# Patient Record
Sex: Male | Born: 1995 | Race: Black or African American | Hispanic: No | Marital: Single | Smoking: Never smoker
Health system: Southern US, Community
[De-identification: ages and names within clinical notes are randomized; demographics above are authoritative.]

## PROBLEM LIST (undated history)

## (undated) DIAGNOSIS — J45909 Unspecified asthma, uncomplicated: Secondary | ICD-10-CM

## (undated) DIAGNOSIS — G43909 Migraine, unspecified, not intractable, without status migrainosus: Secondary | ICD-10-CM

## (undated) HISTORY — PX: OTHER SURGICAL HISTORY: SHX169

---
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
## 2016-02-06 ENCOUNTER — Ambulatory Visit (INDEPENDENT_AMBULATORY_CARE_PROVIDER_SITE_OTHER): Payer: 59 | Admitting: Family Medicine

## 2016-02-06 ENCOUNTER — Encounter: Payer: Self-pay | Admitting: Family Medicine

## 2016-02-06 VITALS — BP 128/82 | HR 68 | Temp 98.4°F | Ht 68.75 in | Wt 161.0 lb

## 2016-02-06 DIAGNOSIS — M79641 Pain in right hand: Secondary | ICD-10-CM | POA: Diagnosis not present

## 2016-02-06 DIAGNOSIS — G43909 Migraine, unspecified, not intractable, without status migrainosus: Secondary | ICD-10-CM | POA: Insufficient documentation

## 2016-02-06 DIAGNOSIS — M79642 Pain in left hand: Secondary | ICD-10-CM | POA: Diagnosis not present

## 2016-02-06 DIAGNOSIS — G43019 Migraine without aura, intractable, without status migrainosus: Secondary | ICD-10-CM

## 2016-02-06 MED ORDER — MELOXICAM 15 MG PO TABS: 15.0000 mg | ORAL_TABLET | Freq: Every day | ORAL | Status: DC

## 2016-02-06 MED ORDER — SUMATRIPTAN SUCCINATE 50 MG PO TABS: 50.0000 mg | ORAL_TABLET | ORAL | Status: DC | PRN

## 2016-02-06 NOTE — Patient Instructions (Signed)
Trial sumatriptan for migraine headaches when they occur (of headache types this is the most likely with symptoms and prior diagnosis)  Let me know if this works for you when you have headaches  Glad you have a week off work. I want you to try antiinflammatory medicine for 10-15 days while you take week off. When you go back to work- see if there is an alternate why to hit the parts that may reduce strain. If still having same issues in 2 weeks- send me mychart message and I will send you a letter to have them find a different position for you hopefully

## 2016-02-06 NOTE — Progress Notes (Signed)
Phone: [phone]  Subjective:  Patient presents today to establish care. Chief complaint-noted.   See problem oriented charting  The following were reviewed and entered/updated in epic: Past Medical History  Diagnosis Date  . Asthma     childhood  . Migraine     Pediatrician diagnosed. Was on medication before. about once a month. Left frontal, throbbing, debilitating. Lasts for hours- eventually goes away.    Patient Active Problem List   Diagnosis Date Noted  . Migraine     Priority: Medium  . Asthma 04/25/2013    Priority: Low   Past Surgical History  Procedure Laterality Date  . None      Family History  Problem Relation Age of Onset  . Stroke Father 2841    stress related reportedly. states no meds    Medications- reviewed and updated No current outpatient prescriptions on file.   No current facility-administered medications for this visit.   Allergies-reviewed and updated No Known Allergies  Social History   Social History  . Marital Status: Single    Spouse Name: N/A  . Number of Children: N/A  . Years of Education: N/A   Social History Main Topics  . Smoking status: Never Smoker   . Smokeless tobacco: None  . Alcohol Use: No  . Drug Use: No  . Sexual Activity: Not Asked   Other Topics Concern  . None   Social History Narrative   Single. Lives with family.    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     ROS--Full ROS was completed Review of Systems  Constitutional: Negative for fever and chills.  HENT: Negative for congestion, ear discharge, hearing loss, nosebleeds, sore throat and tinnitus.   Eyes: Negative for blurred vision and double vision.  Respiratory: Negative for cough and shortness of breath.   Cardiovascular: Negative for  chest pain and palpitations.  Gastrointestinal: Positive for nausea (mild nausea with headaches). Negative for heartburn.  Genitourinary: Negative for dysuria and urgency.  Musculoskeletal: Negative for myalgias and neck pain.  Skin: Negative for itching and rash.  Neurological: Positive for headaches. Negative for dizziness and tingling.  Endo/Heme/Allergies: Negative for polydipsia. Does not bruise/bleed easily.  Psychiatric/Behavioral: Negative for depression, hallucinations and substance abuse.   Objective: BP 128/82 mmHg  Pulse 68  Temp(Src) 98.4 F (36.9 C) (Oral)  Ht 5' 8.75" (1.746 m)  Wt 161 lb (73.029 kg)  BMI 23.96 kg/m2  SpO2 98% Gen: NAD, resting comfortably HEENT: Mucous membranes are moist. Oropharynx normal. TM normal. Eyes: sclera and lids normal, PERRLA Neck: no thyromegaly, no cervical lymphadenopathy CV: RRR no murmurs rubs or gallops Lungs: CTAB no crackles, wheeze, rhonchi Abdomen: soft/nontender/nondistended/normal bowel sounds. No rebound or guarding.  Ext: no edema, pain in palms of hands with palpation mild  Skin: warm, dry Neuro: CN II-XII intact, sensation and reflexes normal throughout, 5/5 muscle strength in bilateral upper and lower extremities. Normal finger to nose. Normal rapid alternating movements. No pronator drift. Normal romberg. Normal gait.   Assessment/Plan:  Bilateral hand pain S:Has to hit an object at work over 2000 units - hitting on hands and that bothers. Sticks around even with a few days off work. Gets worse at work. 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 Hard to bend his finger. Started job a week ago and has been since then. With time away from work symptoms largely resolve- has next week off. This just started about a week ago when he started job- he was told there was no other way to hit the object at work (I has asked patient  several times if there were other ways and he initially acknowledged would ask then when discussing work note shifted and said there was no other way- I still encouraged him to ask) A/P: This appears to be overuse injury. Offered x-ray which he declines with cost concern. Will trial mobic 10-15 days and has at least week off work- if he returns and starts again and there is no othre option for this work movement- will request they find a different position for ptaient if possible    Migraine S: ician diagnosed. Was on medication before. about once a month. Left frontal, throbbing, debilitating. Lasts for hours- eventually goes away.  Some nausea. Review of records shows patient previously with more frequent headaches it appears and sumatriptan did not work in that setting- rebound?  A/P: we will retrial sumatriptan and asked patient to reach out through my chart regarding efficacy. No prophylaxis as states once a month only    Meds ordered this encounter  Medications  . SUMAtriptan (IMITREX) 50 MG tablet    Sig: Take 1 tablet (50 mg total) by mouth every 2 (two) hours as needed for migraine (2 per day maximum). Repeat in 2 hours if headache recurs    Dispense:  10 tablet    Refill:  2  . meloxicam (MOBIC) 15 MG tablet    Sig: Take 1 tablet (15 mg total) by mouth daily.    Dispense:  15 tablet    Refill:  0   Return precautions advised.  Tana ConchStephen Maram Bently, MD

## 2016-02-06 NOTE — Progress Notes (Signed)
Pre visit review using our clinic review tool, if applicable. No additional management support is needed unless otherwise documented below in the visit note. 

## 2016-02-07 NOTE — Assessment & Plan Note (Signed)
S: ician diagnosed. Was on medication before. about once a month. Left frontal, throbbing, debilitating. Lasts for hours- eventually goes away.  Some nausea. Review of records shows patient previously with more frequent headaches it appears and sumatriptan did not work in that setting- rebound?  A/P: we will retrial sumatriptan and asked patient to reach out through my chart regarding efficacy. No prophylaxis as states once a month only

## 2016-03-03 ENCOUNTER — Encounter: Payer: Self-pay | Admitting: Pediatrics

## 2016-03-04 ENCOUNTER — Encounter: Payer: Self-pay | Admitting: Pediatrics

## 2016-04-19 ENCOUNTER — Ambulatory Visit (INDEPENDENT_AMBULATORY_CARE_PROVIDER_SITE_OTHER): Payer: 59 | Admitting: Family Medicine

## 2016-04-19 ENCOUNTER — Encounter: Payer: Self-pay | Admitting: Family Medicine

## 2016-04-19 VITALS — BP 122/62 | HR 83 | Temp 98.1°F | Ht 69.0 in | Wt 161.0 lb

## 2016-04-19 DIAGNOSIS — G43019 Migraine without aura, intractable, without status migrainosus: Secondary | ICD-10-CM

## 2016-04-19 DIAGNOSIS — R6889 Other general symptoms and signs: Secondary | ICD-10-CM | POA: Diagnosis not present

## 2016-04-19 DIAGNOSIS — Z0001 Encounter for general adult medical examination with abnormal findings: Secondary | ICD-10-CM | POA: Diagnosis not present

## 2016-04-19 MED ORDER — SUMATRIPTAN SUCCINATE 50 MG PO TABS: 50.0000 mg | ORAL_TABLET | ORAL | 2 refills | Status: DC | PRN

## 2016-04-19 MED ORDER — NADOLOL 20 MG PO TABS: 20.0000 mg | ORAL_TABLET | Freq: Every day | ORAL | 5 refills | Status: DC

## 2016-04-19 NOTE — Patient Instructions (Addendum)
Let us know when you get flu shot either by phone or mychart  Trial nadolol 20mg  to help prevent headaches. My hope is that instead of 3-4 that you may have 1-2 instead. You can use the sumaptriptan as needed if you still have migraines  Follow up with me in 4-6 weeks to recheck- may have to increase dose

## 2016-04-19 NOTE — Assessment & Plan Note (Signed)
S: patient states at last visit that he underestimated his HA frequency. He has noted since that time 3-4 migraines a week. 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  A/P: Will trial nadolol 20mg  daily for prophylaxis since atenolol on back order. He never picked up sumatriptan-states he was told needed to be resent so sent this in today. 

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
## 2016-05-11 ENCOUNTER — Encounter (HOSPITAL_COMMUNITY): Payer: Self-pay | Admitting: Family Medicine

## 2016-05-11 ENCOUNTER — Ambulatory Visit (HOSPITAL_COMMUNITY)
Admission: EM | Admit: 2016-05-11 | Discharge: 2016-05-11 | Disposition: A | Discharge status: Home / Self Care | Payer: 59 | Attending: Family Medicine | Admitting: Family Medicine

## 2016-05-11 DIAGNOSIS — R05 Cough: Secondary | ICD-10-CM

## 2016-05-11 DIAGNOSIS — R0982 Postnasal drip: Secondary | ICD-10-CM | POA: Diagnosis not present

## 2016-05-11 DIAGNOSIS — J329 Chronic sinusitis, unspecified: Secondary | ICD-10-CM

## 2016-05-11 DIAGNOSIS — J00 Acute nasopharyngitis [common cold]: Secondary | ICD-10-CM | POA: Diagnosis not present

## 2016-05-11 DIAGNOSIS — J310 Chronic rhinitis: Secondary | ICD-10-CM

## 2016-05-11 DIAGNOSIS — R059 Cough, unspecified: Secondary | ICD-10-CM

## 2016-05-11 NOTE — ED Provider Notes (Signed)
CSN: [id number]     Arrival date & time 05/11/16  1658 History   First MD Initiated Contact with Patient 05/11/16 1813     No chief complaint on file.  (Consider location/radiation/quality/duration/timing/severity/associated sxs/prior Treatment) 20 year old male's coming by his grandmother with complaints of cough, sore throat, headache, sinus pressure, rhinorrhea. He states he started about 2 weeks ago. He states he thinks he may have had a fever today he felt hot and was administered ibuprofen. No shortness of breath. Current temperature is 98.5.      Past Medical History:  Diagnosis Date  . Asthma    childhood  . Migraine    Pediatrician diagnosed. Was on medication before. about once a month. Left frontal, throbbing, debilitating. Lasts for hours- eventually goes away.    Past Surgical History:  Procedure Laterality Date  . none     Family History  Problem Relation Age of Onset  . Stroke Father 4641    stress related reportedly. states no meds   Social History  Substance Use Topics  . Smoking status: Never Smoker  . Smokeless tobacco: Not on file  . Alcohol use No    Review of Systems  Constitutional: Positive for activity change and fever. Negative for diaphoresis and fatigue.  HENT: Positive for congestion, postnasal drip and voice change. Negative for ear pain, facial swelling, rhinorrhea, sore throat and trouble swallowing.   Eyes: Negative for pain, discharge and redness.  Respiratory: Positive for cough. Negative for chest tightness and shortness of breath.   Cardiovascular: Negative.   Gastrointestinal: Negative.   Musculoskeletal: Negative.  Negative for neck pain and neck stiffness.  Neurological: Negative.   All other systems reviewed and are negative.   Allergies  Review of patient's allergies indicates no known allergies.  Home Medications   Prior to Admission medications   Medication Sig Start Date End Date Taking? Authorizing Provider  nadolol  (CORGARD) 20 MG tablet Take 1 tablet (20 mg total) by mouth daily. 04/19/16   Shelva MajesticStephen O Hunter, MD  SUMAtriptan (IMITREX) 50 MG tablet Take 1 tablet (50 mg total) by mouth every 2 (two) hours as needed for migraine (2 per day maximum). Repeat in 2 hours if headache recurs 04/19/16   Shelva MajesticStephen O Hunter, MD   Meds Ordered and Administered this Visit  Medications - No data to display  BP 111/77 (BP Location: Left Arm)   Pulse 81   Temp 98.5 F (36.9 C) (Oral)   Resp 16   SpO2 100%  No data found.   Physical Exam  Constitutional: He is oriented to person, place, and time. He appears well-developed and well-nourished. No distress.  HENT:  Head: Normocephalic and atraumatic.  Bilateral TMs are normal. Oropharynx with much cobblestoning and moderate amount of clear PND. No exudates or swelling.  Eyes: EOM are normal.  Neck: Normal range of motion. Neck supple.  Cardiovascular: Normal rate, regular rhythm and normal heart sounds.   Pulmonary/Chest: Effort normal and breath sounds normal. He has no wheezes. He has no rales.  Normal inspiratory neck toward phases. No wheezing or other rapid tissue sounds with forced expiration.  Musculoskeletal: Normal range of motion.  Lymphadenopathy:    He has no cervical adenopathy.  Neurological: He is alert and oriented to person, place, and time.  Skin: Skin is warm and dry.  Psychiatric: He has a normal mood and affect.  Nursing note and vitals reviewed.   Urgent Care Course   Clinical Course    Procedures (including  critical care time)  Labs Review Labs Reviewed - No data to display  Imaging Review No results found.   Visual Acuity Review  Right Eye Distance:   Left Eye Distance:   Bilateral Distance:    Right Eye Near:   Left Eye Near:    Bilateral Near:         MDM   1. Acute nasopharyngitis   2. Rhinosinusitis   3. Cough   4. PND (post-nasal drip)    Your cough is likely due to the amount of drainage in the back of  your throat. Your lungs sound clear today. Take the following medications as directed for your symptoms. For nasal and head congestion may take Sudafed PE 10 mg every 4 hours as needed. Saline nasal spray used frequently. For drainage may use Allegra, Claritin or Zyrtec. If you need stronger medicine to stop drainage may take Chlor-Trimeton 2-4 mg every 4 hours. This may cause drowsiness. Ibuprofen 600 mg every 6 hours as needed for pain, discomfort or fever. Drink plenty of fluids and stay well-hydrated. Flonase or Rhinocort nasal spray daily     Hayden Rasmussen, NP 05/11/16 1833    Hayden Rasmussen, NP 05/11/16 1836

## 2016-05-11 NOTE — ED Triage Notes (Signed)
Pt here for URI symptoms.  

## 2016-05-11 NOTE — Discharge Instructions (Signed)
Your cough is likely due to the amount of drainage in the back of your throat. Your lungs sound clear today. Take the following medications as directed for your symptoms. For nasal and head congestion may take Sudafed PE 10 mg every 4 hours as needed. Saline nasal spray used frequently. For drainage may use Allegra, Claritin or Zyrtec. If you need stronger medicine to stop drainage may take Chlor-Trimeton 2-4 mg every 4 hours. This may cause drowsiness. Ibuprofen 600 mg every 6 hours as needed for pain, discomfort or fever. Drink plenty of fluids and stay well-hydrated. Flonase or Rhinocort nasal spray daily

## 2016-07-15 ENCOUNTER — Emergency Department (HOSPITAL_COMMUNITY)
Admission: EM | Admit: 2016-07-15 | Discharge: 2016-07-16 | Disposition: A | Discharge status: Home / Self Care | Payer: 59 | Attending: Emergency Medicine | Admitting: Emergency Medicine

## 2016-07-15 ENCOUNTER — Encounter (HOSPITAL_COMMUNITY): Payer: Self-pay | Admitting: *Deleted

## 2016-07-15 DIAGNOSIS — G43809 Other migraine, not intractable, without status migrainosus: Secondary | ICD-10-CM

## 2016-07-15 DIAGNOSIS — G43909 Migraine, unspecified, not intractable, without status migrainosus: Secondary | ICD-10-CM | POA: Diagnosis present

## 2016-07-15 DIAGNOSIS — J45909 Unspecified asthma, uncomplicated: Secondary | ICD-10-CM | POA: Insufficient documentation

## 2016-07-15 MED ORDER — DIPHENHYDRAMINE HCL 50 MG/ML IJ SOLN
25.0000 mg | Freq: Once | INTRAMUSCULAR | Status: AC
  Administered 2016-07-16: 25 mg via INTRAVENOUS
  Filled 2016-07-15: qty 1

## 2016-07-15 MED ORDER — DEXAMETHASONE SODIUM PHOSPHATE 10 MG/ML IJ SOLN
10.0000 mg | Freq: Once | INTRAMUSCULAR | Status: AC
  Administered 2016-07-16: 10 mg via INTRAVENOUS
  Filled 2016-07-15: qty 1

## 2016-07-15 MED ORDER — PROCHLORPERAZINE EDISYLATE 5 MG/ML IJ SOLN
10.0000 mg | Freq: Once | INTRAMUSCULAR | Status: AC
  Administered 2016-07-16: 10 mg via INTRAVENOUS
  Filled 2016-07-15: qty 2

## 2016-07-15 MED ORDER — KETOROLAC TROMETHAMINE 30 MG/ML IJ SOLN
30.0000 mg | Freq: Once | INTRAMUSCULAR | Status: AC
  Administered 2016-07-16: 30 mg via INTRAVENOUS
  Filled 2016-07-15: qty 1

## 2016-07-15 MED ORDER — SODIUM CHLORIDE 0.9 % IV BOLUS (SEPSIS)
1000.0000 mL | Freq: Once | INTRAVENOUS | Status: AC
  Administered 2016-07-16: 1000 mL via INTRAVENOUS

## 2016-07-15 NOTE — ED Provider Notes (Signed)
[facility]-EMERGENCY DEPT Provider Note   CSN: [id number] Arrival date & time: 07/15/16  2246  By signing my name below, I, Tom Patterson, attest that this documentation has been prepared under the direction and in the presence of Gilda Creasehristopher J Travarius Lange, MD. Electronically signed, Tom Patterson, ED Scribe. 07/15/16. 11:49 PM.  History   Chief Complaint Chief Complaint  Patient presents with  . Migraine    HPI HPI Comments: Tom Patterson is a 20 y.o. male who presents to the Emergency Department complaining of persistent migraine with associated photophobia that started two days ago. He also complains of intermittent lightheadedness. Pt has Hx of migraines but mother states that this is the worst he has been. Pt is prescribed medication for his headaches and mother gave him 800mg  ibuprofen as well as a leftover Vicodin from a previous procedure; none of these relieved his symptoms. No nausea or vomiting.  The history is provided by the patient and a parent. No language interpreter was used.    Past Medical History:  Diagnosis Date  . Asthma    childhood  . Migraine    Pediatrician diagnosed. Was on medication before. about once a month. Left frontal, throbbing, debilitating. Lasts for hours- eventually goes away.     Patient Active Problem List   Diagnosis Date Noted  . Migraine   . Asthma 04/25/2013    Past Surgical History:  Procedure Laterality Date  . none       Home Medications    Prior to Admission medications   Medication Sig Start Date End Date Taking? Authorizing Provider  nadolol (CORGARD) 20 MG tablet Take 1 tablet (20 mg total) by mouth daily. 04/19/16   Shelva MajesticStephen O Hunter, MD  SUMAtriptan (IMITREX) 50 MG tablet Take 1 tablet (50 mg total) by mouth every 2 (two) hours as needed for migraine (2 per day maximum). Repeat in 2 hours if headache recurs 04/19/16   Shelva MajesticStephen O Hunter, MD    Family History Family History  Problem Relation Age of Onset  . Stroke Father 4541   stress related reportedly. states no meds    Social History Social History  Substance Use Topics  . Smoking status: Never Smoker  . Smokeless tobacco: Never Used  . Alcohol use No   Allergies   Patient has no known allergies.   Review of Systems Review of Systems  Eyes: Positive for photophobia.  Gastrointestinal: Negative for nausea and vomiting.  Neurological: Positive for headaches.  All other systems reviewed and are negative.    Physical Exam Updated Vital Signs BP 111/57   Pulse (!) 58   Temp 98.2 F (36.8 C) (Oral)   Resp 16   Ht 5\' 10"  (1.778 m)   Wt 156 lb (70.8 kg)   SpO2 98%   BMI 22.38 kg/m   Physical Exam  Constitutional: He is oriented to person, place, and time. He appears well-developed and well-nourished. No distress.  HENT:  Head: Normocephalic and atraumatic.  Right Ear: Hearing normal.  Left Ear: Hearing normal.  Nose: Nose normal.  Mouth/Throat: Oropharynx is clear and moist and mucous membranes are normal.  Eyes: Conjunctivae and EOM are normal. Pupils are equal, round, and reactive to light.  Neck: Normal range of motion. Neck supple.  Cardiovascular: Regular rhythm, S1 normal and S2 normal.  Exam reveals no gallop and no friction rub.   No murmur heard. Pulmonary/Chest: Effort normal and breath sounds normal. No respiratory distress. He exhibits no tenderness.  Abdominal: Soft. Normal appearance  and bowel sounds are normal. There is no hepatosplenomegaly. There is no tenderness. There is no rebound, no guarding, no tenderness at McBurney's point and negative Murphy's sign. No hernia.  Musculoskeletal: Normal range of motion.  Neurological: He is alert and oriented to person, place, and time. He has normal strength. No cranial nerve deficit or sensory deficit. Coordination normal. GCS eye subscore is 4. GCS verbal subscore is 5. GCS motor subscore is 6.  Extraocular muscle movement: normal No visual field cut Pupils: equal and reactive both  direct and consensual response is normal No nystagmus present    Sensory function is intact to light touch, pinprick Proprioception intact  Grip strength 5/5 symmetric in upper extremities No pronator drift Normal finger to nose bilaterally  Lower extremity strength 5/5 against gravity Normal heel to shin bilaterally  Gait: normal   Skin: Skin is warm, dry and intact. No rash noted. No cyanosis.  Psychiatric: He has a normal mood and affect. His speech is normal and behavior is normal. Thought content normal.  Nursing note and vitals reviewed.    ED Treatments / Results  DIAGNOSTIC STUDIES: Oxygen Saturation is 100% on RA, normal by my interpretation.  COORDINATION OF CARE: 11:44 PM-Discussed treatment plan with pt at bedside and pt agreed to plan.   Labs (all labs ordered are listed, but only abnormal results are displayed) Labs Reviewed - No data to display  EKG  EKG Interpretation None       Radiology No results found.  Procedures Procedures (including critical care time)  Medications Ordered in ED Medications  sodium chloride 0.9 % bolus 1,000 mL (0 mLs Intravenous Stopped 07/16/16 0137)  dexamethasone (DECADRON) injection 10 mg (10 mg Intravenous Given 07/16/16 0012)  ketorolac (TORADOL) 30 MG/ML injection 30 mg (30 mg Intravenous Given 07/16/16 0012)  prochlorperazine (COMPAZINE) injection 10 mg (10 mg Intravenous Given 07/16/16 0012)  diphenhydrAMINE (BENADRYL) injection 25 mg (25 mg Intravenous Given 07/16/16 0012)     Initial Impression / Assessment and Plan / ED Course  I have reviewed the triage vital signs and the nursing notes.  Pertinent labs & imaging results that were available during my care of the patient were reviewed by me and considered in my medical decision making (see chart for details).  Clinical Course    She presents to the emergency room with complaints of migraine headache. Patient has a history of chronic recurrent migraines with  similar features. No unusual findings on examination, neurologic examination is normal. Patient has had complete resolution of his headache with migraine cocktail. Patient appropriate for discharge.  Final Clinical Impressions(s) / ED Diagnoses   Final diagnoses:  Other migraine without status migrainosus, not intractable    New Prescriptions New Prescriptions   No medications on file  I personally performed the services described in this documentation, which was scribed in my presence. The recorded information has been reviewed and is accurate.     Gilda Creasehristopher J Kenston Longton, MD 07/16/16 [phone]

## 2016-07-15 NOTE — ED Triage Notes (Signed)
Pt c/o migraine x 2 days with hx of the same. Denies NV

## 2016-07-16 NOTE — ED Notes (Signed)
Pt in the room with the light off and sleeping. Family in the room with him. Says he feels much better and no headache

## 2016-09-02 DIAGNOSIS — Z041 Encounter for examination and observation following transport accident: Secondary | ICD-10-CM | POA: Diagnosis not present

## 2016-09-02 DIAGNOSIS — S30810A Abrasion of lower back and pelvis, initial encounter: Secondary | ICD-10-CM | POA: Diagnosis not present

## 2016-09-02 DIAGNOSIS — M25512 Pain in left shoulder: Secondary | ICD-10-CM | POA: Diagnosis not present

## 2016-09-02 DIAGNOSIS — S43402A Unspecified sprain of left shoulder joint, initial encounter: Secondary | ICD-10-CM | POA: Diagnosis not present

## 2016-09-02 DIAGNOSIS — S20412A Abrasion of left back wall of thorax, initial encounter: Secondary | ICD-10-CM | POA: Diagnosis not present

## 2016-09-03 ENCOUNTER — Encounter (HOSPITAL_COMMUNITY): Payer: Self-pay | Admitting: *Deleted

## 2016-09-03 ENCOUNTER — Emergency Department (HOSPITAL_COMMUNITY)
Admission: EM | Admit: 2016-09-03 | Discharge: 2016-09-03 | Disposition: A | Discharge status: Home / Self Care | Payer: 59 | Attending: Emergency Medicine | Admitting: Emergency Medicine

## 2016-09-03 DIAGNOSIS — M62830 Muscle spasm of back: Secondary | ICD-10-CM

## 2016-09-03 DIAGNOSIS — J45909 Unspecified asthma, uncomplicated: Secondary | ICD-10-CM | POA: Diagnosis not present

## 2016-09-03 DIAGNOSIS — M546 Pain in thoracic spine: Secondary | ICD-10-CM | POA: Diagnosis present

## 2016-09-03 LAB — CBG MONITORING, ED: Glucose-Capillary: 77 mg/dL (ref 65–99)

## 2016-09-03 MED ORDER — CYCLOBENZAPRINE HCL 10 MG PO TABS: 10.0000 mg | ORAL_TABLET | Freq: Three times a day (TID) | ORAL | 0 refills | Status: DC | PRN

## 2016-09-03 MED ORDER — NAPROXEN 250 MG PO TABS
500.0000 mg | ORAL_TABLET | Freq: Once | ORAL | Status: AC
  Administered 2016-09-03: 500 mg via ORAL
  Filled 2016-09-03: qty 2

## 2016-09-03 MED ORDER — CYCLOBENZAPRINE HCL 10 MG PO TABS
10.0000 mg | ORAL_TABLET | Freq: Once | ORAL | Status: AC
  Administered 2016-09-03: 10 mg via ORAL
  Filled 2016-09-03: qty 1

## 2016-09-03 MED ORDER — NAPROXEN 500 MG PO TABS: 500.0000 mg | ORAL_TABLET | Freq: Two times a day (BID) | ORAL | 0 refills | Status: DC
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
## 2016-09-07 ENCOUNTER — Encounter: Payer: Self-pay | Admitting: Family Medicine

## 2016-09-07 ENCOUNTER — Ambulatory Visit (INDEPENDENT_AMBULATORY_CARE_PROVIDER_SITE_OTHER): Payer: 59 | Admitting: Family Medicine

## 2016-09-07 VITALS — BP 128/82 | HR 71 | Temp 98.7°F | Ht 70.0 in | Wt 167.2 lb

## 2016-09-07 DIAGNOSIS — E162 Hypoglycemia, unspecified: Secondary | ICD-10-CM | POA: Diagnosis not present

## 2016-09-07 DIAGNOSIS — R55 Syncope and collapse: Secondary | ICD-10-CM | POA: Diagnosis not present

## 2016-09-07 NOTE — Patient Instructions (Addendum)
Sign release of information at the check out desk for records from ED visit to [facility].   We will call you within a week or two about your referral to neurology (asked them to see you next week). If you do not hear within 3 weeks, give us a call.   You should not drive until cleared by neurology.   Labs before you leave

## 2016-09-07 NOTE — Progress Notes (Signed)
Pre visit review using our clinic review tool, if applicable. No additional management support is needed unless otherwise documented below in the visit note. 
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
## 2016-09-08 ENCOUNTER — Telehealth: Payer: Self-pay | Admitting: Family Medicine

## 2016-09-08 LAB — CBC
HCT: 47.8 % (ref 39.0–52.0)
Hemoglobin: 16.5 g/dL (ref 13.0–17.0)
MCHC: 34.4 g/dL (ref 30.0–36.0)
MCV: 89.3 fl (ref 78.0–100.0)
Platelets: 197 10*3/uL (ref 150.0–400.0)
RBC: 5.36 Mil/uL (ref 4.22–5.81)
RDW: 12.3 % (ref 11.5–14.6)
WBC: 5.2 10*3/uL (ref 4.5–10.5)

## 2016-09-08 LAB — COMPREHENSIVE METABOLIC PANEL
ALT: 25 U/L (ref 0–53)
AST: 23 U/L (ref 0–37)
Albumin: 4.8 g/dL (ref 3.5–5.2)
Alkaline Phosphatase: 66 U/L (ref 39–117)
BUN: 12 mg/dL (ref 6–23)
CALCIUM: 9.7 mg/dL (ref 8.4–10.5)
CO2: 27 meq/L (ref 19–32)
CREATININE: 0.9 mg/dL (ref 0.40–1.50)
Chloride: 106 mEq/L (ref 96–112)
GFR: 138.33 mL/min (ref >60.00)
Glucose, Bld: 87 mg/dL (ref 70–99)
Potassium: 3.9 mEq/L (ref 3.5–5.1)
SODIUM: 140 meq/L (ref 135–145)
Total Bilirubin: 0.5 mg/dL (ref 0.2–1.2)
Total Protein: 8.1 g/dL (ref 6.0–8.3)

## 2016-09-08 LAB — HEMOGLOBIN A1C: HEMOGLOBIN A1C: 5.4 % (ref 4.6–6.5)

## 2016-09-08 LAB — TSH: TSH: 2.67 u[IU]/mL (ref 0.35–5.50)

## 2016-09-08 NOTE — Telephone Encounter (Signed)
Spoke to patient and placed work note at front desk for him to pick up at this convenience. Patient verbalized understanding

## 2016-09-08 NOTE — Telephone Encounter (Signed)
Patient is requesting a Dr note from 09/07/2015 through 09/10/2015 returning to work on Friday.Patient also would like to know if he needs to schedule an appointment for his shoulder.  Contact Info: [phone]

## 2016-09-10 ENCOUNTER — Telehealth: Payer: Self-pay | Admitting: Family Medicine
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
## 2016-09-16 ENCOUNTER — Ambulatory Visit (INDEPENDENT_AMBULATORY_CARE_PROVIDER_SITE_OTHER): Payer: 59 | Admitting: Neurology

## 2016-09-16 ENCOUNTER — Encounter: Payer: Self-pay | Admitting: Neurology

## 2016-09-16 VITALS — BP 100/70 | HR 73 | Ht 70.0 in | Wt 167.0 lb

## 2016-09-16 DIAGNOSIS — R55 Syncope and collapse: Secondary | ICD-10-CM

## 2016-09-16 DIAGNOSIS — R4020 Unspecified coma: Secondary | ICD-10-CM
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
## 2016-09-16 NOTE — Patient Instructions (Signed)
1.  MRI brain wwo contrast 2.  Sleep-deprived 1hr EEG 3.  48-hr Holter testing 4.  No driving  Return to clinic in 6-8 weeks

## 2016-09-21 ENCOUNTER — Other Ambulatory Visit: Payer: Self-pay | Admitting: Neurology

## 2016-09-22 ENCOUNTER — Other Ambulatory Visit: Payer: 59

## 2016-09-23 ENCOUNTER — Other Ambulatory Visit: Payer: 59

## 2016-09-29 ENCOUNTER — Other Ambulatory Visit: Payer: 59

## 2016-10-08 ENCOUNTER — Encounter: Payer: Self-pay | Admitting: Family Medicine

## 2016-10-08 ENCOUNTER — Ambulatory Visit (INDEPENDENT_AMBULATORY_CARE_PROVIDER_SITE_OTHER): Payer: 59 | Admitting: Family Medicine

## 2016-10-08 ENCOUNTER — Telehealth: Payer: Self-pay | Admitting: Family Medicine

## 2016-10-08 VITALS — BP 118/70 | HR 90 | Temp 98.7°F | Ht 70.0 in | Wt 166.8 lb

## 2016-10-08 DIAGNOSIS — M25512 Pain in left shoulder: Secondary | ICD-10-CM | POA: Diagnosis not present

## 2016-10-08 MED ORDER — PREDNISONE 20 MG PO TABS: ORAL_TABLET | ORAL | 0 refills | Status: DC

## 2016-10-08 NOTE — Progress Notes (Signed)
Pre visit review using our clinic review tool, if applicable. No additional management support is needed unless otherwise documented below in the visit note. 

## 2016-10-08 NOTE — Patient Instructions (Signed)
I would trial icing the shoulder/neck region 20 minutes three times a day for 3 days then switching to heat 20 minutes three times a day  Trial course of prednisone- would help if this is coming from inflammation or from the neck  I would advise follow up in about  2 weeks if not improving. We could also consider sports medicine referral

## 2016-10-08 NOTE — Telephone Encounter (Signed)
Called and spoke to patient. He is scheduled to be evaluated at 4:00 today
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
## 2016-10-08 NOTE — Telephone Encounter (Addendum)
Pt was seen on 09-07-16 and now needs a note due to shoulder pain. Pt in [employer] and has physical fitness test and due to shoulder pain he is unable to do push ups and sit ups. Please call pt when note is ready

## 2016-10-08 NOTE — Telephone Encounter (Signed)
Would need office visit to determine this and possibly treatment or further workup including possible sports medicine.

## 2016-11-04 ENCOUNTER — Inpatient Hospital Stay: Admission: RE | Admit: 2016-11-04 | Payer: 59 | Source: Ambulatory Visit

## 2016-11-05 DIAGNOSIS — J069 Acute upper respiratory infection, unspecified: Secondary | ICD-10-CM | POA: Insufficient documentation

## 2016-11-05 DIAGNOSIS — J45909 Unspecified asthma, uncomplicated: Secondary | ICD-10-CM | POA: Diagnosis not present

## 2016-11-05 DIAGNOSIS — Z79899 Other long term (current) drug therapy: Secondary | ICD-10-CM | POA: Diagnosis not present

## 2016-11-05 DIAGNOSIS — J029 Acute pharyngitis, unspecified: Secondary | ICD-10-CM | POA: Diagnosis present

## 2016-11-06 ENCOUNTER — Emergency Department (HOSPITAL_COMMUNITY)
Admission: EM | Admit: 2016-11-06 | Discharge: 2016-11-06 | Disposition: A | Discharge status: Home / Self Care | Payer: 59 | Attending: Emergency Medicine | Admitting: Emergency Medicine

## 2016-11-06 ENCOUNTER — Encounter (HOSPITAL_COMMUNITY): Payer: Self-pay

## 2016-11-06 DIAGNOSIS — J069 Acute upper respiratory infection, unspecified: Secondary | ICD-10-CM

## 2016-11-06 LAB — RAPID STREP SCREEN (MED CTR MEBANE ONLY): STREPTOCOCCUS, GROUP A SCREEN (DIRECT): NEGATIVE

## 2016-11-06 MED ORDER — FLUTICASONE PROPIONATE 50 MCG/ACT NA SUSP: 2.0000 | Freq: Every day | NASAL | 0 refills | Status: DC

## 2016-11-06 MED ORDER — DM-GUAIFENESIN ER 30-600 MG PO TB12: 1.0000 | ORAL_TABLET | Freq: Two times a day (BID) | ORAL | 0 refills | Status: DC | PRN

## 2016-11-06 MED ORDER — BENZONATATE 100 MG PO CAPS: 100.0000 mg | ORAL_CAPSULE | Freq: Three times a day (TID) | ORAL | 0 refills | Status: DC | PRN

## 2016-11-06 NOTE — ED Notes (Signed)
Pt verbalized understanding discharge instructions and denies any further needs or questions at this time. VS stable, ambulatory and steady gait.   

## 2016-11-06 NOTE — Discharge Instructions (Signed)
1. Medications: flonase, mucinex, tessalon, usual home medications 2. Treatment: rest, drink plenty of fluids, take tylenol or ibuprofen for fever control 3. Follow Up: Please followup with your primary doctor in 7 days for discussion of your diagnoses and further evaluation after today's visit; if you do not have a primary care doctor use the resource guide provided to find one; Return to the ER for high fevers, difficulty breathing or other concerning symptoms   Get help right away if: You have severe or persistent: Headache. Ear pain. Sinus pain. Chest pain. You have chronic lung disease and any of the following: Wheezing. Prolonged cough. Coughing up blood. A change in your usual mucus. You have a stiff neck. You have changes in your: Vision. Hearing. Thinking. Mood.

## 2016-11-06 NOTE — ED Triage Notes (Signed)
Pt endorses sore throat x 2 days with cough. Breath sounds clear. VSS. Redness noted to throat.

## 2016-11-06 NOTE — ED Provider Notes (Signed)
[facility]PT Provider Note   CSN: [id number] Arrival date & time: 11/05/16  2359     History   Chief Complaint Chief Complaint  Patient presents with  . Sore Throat    HPI Tom Patterson is a 21 y.o. male with PMHx of asthma, migraine Presents today with complaints of sore throat 2 days. He reports his sore throat has an achy, sore 8/10. He reports associated painful while swallowing, productive cough and nasal congestion. He states that has not tried anything for her symptoms. He reports recent sick contacts at home. He reports a history of exercise-induced asthma when he was younger. He denies any hx of COPD or smoking. He denies any fevers, chills, nausea, vomiting, diarrhea, abdominal pain, trouble breathing, trouble swallowing.  The history is provided by the patient. No language interpreter was used.    Past Medical History:  Diagnosis Date  . Asthma    childhood  . Migraine    Pediatrician diagnosed. Was on medication before. about once a month. Left frontal, throbbing, debilitating. Lasts for hours- eventually goes away.     Patient Active Problem List   Diagnosis Date Noted  . Hypoglycemia 09/07/2016  . Migraine   . Asthma 04/25/2013    Past Surgical History:  Procedure Laterality Date  . none         Home Medications    Prior to Admission medications   Medication Sig Start Date End Date Taking? Authorizing Provider  benzonatate (TESSALON) 100 MG capsule Take 1 capsule (100 mg total) by mouth 3 (three) times daily as needed for cough. 11/06/16   Kiylee Thoreson Manuel [place], [place]  cyclobenzaprine (FLEXERIL) 10 MG tablet Take 1 tablet (10 mg total) by mouth 3 (three) times daily as needed for muscle spasms. 09/03/16   Angelina Ok, MD  dextromethorphan-guaiFENesin [facility] DM) 30-600 MG 12hr tablet Take 1 tablet by mouth 2 (two) times daily as needed for cough. 11/06/16   Leshae Mcclay Manuel [place], [place]  fluticasone (FLONASE) 50 MCG/ACT nasal spray Place 2  sprays into both nostrils daily. 11/06/16   Hamza Empson Manuel [place], [place]  nadolol (CORGARD) 20 MG tablet Take 1 tablet (20 mg total) by mouth daily. 04/19/16   Shelva Majestic, MD  naproxen (NAPROSYN) 500 MG tablet Take 1 tablet (500 mg total) by mouth 2 (two) times daily with a meal. 09/03/16   Angelina Ok, MD  predniSONE (DELTASONE) 20 MG tablet Take 1 tablet by mouth daily for 5 days, then 1/2 tablet daily for 2 days 10/08/16   Shelva Majestic, MD  SUMAtriptan (IMITREX) 50 MG tablet Take 1 tablet (50 mg total) by mouth every 2 (two) hours as needed for migraine (2 per day maximum). Repeat in 2 hours if headache recurs 04/19/16   Shelva Majestic, MD    Family History Family History  Problem Relation Age of Onset  . Healthy Mother   . Heart murmur Mother   . Stroke Father 46    stress related reportedly. states no meds  . ADD / ADHD Brother     Social History Social History  Substance Use Topics  . Smoking status: Never Smoker  . Smokeless tobacco: Never Used  . Alcohol use No     Allergies   Patient has no known allergies.   Review of Systems Review of Systems  Constitutional: Negative for chills and fever.  HENT: Positive for sore throat.      Physical Exam Updated Vital Signs BP 135/70   Pulse 85  Temp 98.4 F (36.9 C) (Oral)   Resp 16   Ht  (1.778 m)   Wt 75.8 kg   SpO2 100%   BMI 23.96 kg/m   Physical Exam  Constitutional: He is oriented to person, place, and time. He appears well-developed and well-nourished.  Well appearing. He has patent airway. No stridor, no trismus, no respiratory distress, handling secretions well.  HENT:  Head: Normocephalic and atraumatic.  Right Ear: External ear normal.  Left Ear: External ear normal.  Nose: Nose normal.  Mouth/Throat: Oropharynx is clear and moist. No oropharyngeal exudate.  Oropharynx without evidence of redness or exudates. Tonsils without evidence of redness, swelling, or exudates. TM's appear  normal with no evidence of bulging. EAC appear non erythematous and not swollen  Eyes: EOM are normal. Pupils are equal, round, and reactive to light.  Neck: Normal range of motion.  Normal ROM. No nuchal rigidity.   Cardiovascular: Normal rate, normal heart sounds and intact distal pulses.   Pulmonary/Chest: Effort normal and breath sounds normal. No respiratory distress. He has no wheezes. He has no rales.  Lungs CTA. No wheezing. No rales. No stridor. Normal work of breathing  Abdominal: Soft. There is no tenderness. There is no rebound and no guarding.  Soft and nontender. No rebound. No guarding. Negative murphy's sign. No focal tenderness at McBurney's point. No CVA tenderness. No evidence of hernia  Lymphadenopathy:    He has cervical adenopathy.  Neurological: He is alert and oriented to person, place, and time.  Skin: Skin is warm.  Psychiatric: He has a normal mood and affect. His behavior is normal.  Nursing note and vitals reviewed.    ED Treatments / Results  Labs (all labs ordered are listed, but only abnormal results are displayed) Labs Reviewed  RAPID STREP SCREEN (NOT AT [facility])  CULTURE, GROUP A STREP [facility])    EKG  EKG Interpretation None       Radiology No results found.  Procedures Procedures (including critical care time)  Medications Ordered in ED Medications - No data to display   Initial Impression / Assessment and Plan / ED Course  I have reviewed the triage vital signs and the nursing notes.  Pertinent labs & imaging results that were available during my care of the patient were reviewed by me and considered in my medical decision making (see chart for details).    Patients symptoms are consistent with URI, likely viral etiology. On exam, pt in NAD. Hemodynamically stable. No hypoxia. Afebrile. Lungs clear, Heart sounds clear. Normal work of breathing. TMs clear. Throat benign. Abdomen nontender/soft. Strep test negative.  Discussed that  antibiotics are not indicated for viral infections. Pt will be discharged with symptomatic treatment.  Verbalizes understanding and is agreeable with plan. Pt is hemodynamically stable & in NAD prior to dc.   Final Clinical Impressions(s) / ED Diagnoses   Final diagnoses:  Viral upper respiratory tract infection    New Prescriptions New Prescriptions   BENZONATATE (TESSALON) 100 MG CAPSULE    Take 1 capsule (100 mg total) by mouth 3 (three) times daily as needed for cough.   DEXTROMETHORPHAN-GUAIFENESIN (MUCINEX DM) 30-600 MG 12HR TABLET    Take 1 tablet by mouth 2 (two) times daily as needed for cough.   FLUTICASONE (FLONASE) 50 MCG/ACT NASAL SPRAY    Place 2 sprays into both nostrils daily.     [address] [place], [place] 11/06/16 0126    Geoffery Lyons, MD 11/06/16 1524

## 2016-11-08 LAB — CULTURE, GROUP A STREP (THRC)

## 2016-11-12 ENCOUNTER — Telehealth: Payer: Self-pay | Admitting: Family Medicine

## 2016-11-12 NOTE — Telephone Encounter (Signed)
° ° °  Pt call to say he is still having problems with his shoulder and is asking if Dr Durene Cal can write him another note. Would like a call back

## 2016-11-12 NOTE — Telephone Encounter (Signed)
Returned the patient call and left a message for a call back in the office.

## 2016-11-13 ENCOUNTER — Emergency Department (HOSPITAL_COMMUNITY)
Admission: EM | Admit: 2016-11-13 | Discharge: 2016-11-13 | Disposition: A | Discharge status: Home / Self Care | Payer: 59 | Attending: Emergency Medicine | Admitting: Emergency Medicine

## 2016-11-13 ENCOUNTER — Emergency Department (HOSPITAL_COMMUNITY): Payer: 59

## 2016-11-13 ENCOUNTER — Encounter (HOSPITAL_COMMUNITY): Payer: Self-pay | Admitting: *Deleted

## 2016-11-13 DIAGNOSIS — Y999 Unspecified external cause status: Secondary | ICD-10-CM | POA: Insufficient documentation

## 2016-11-13 DIAGNOSIS — Y929 Unspecified place or not applicable: Secondary | ICD-10-CM | POA: Insufficient documentation

## 2016-11-13 DIAGNOSIS — R51 Headache: Secondary | ICD-10-CM | POA: Diagnosis not present

## 2016-11-13 DIAGNOSIS — Z79899 Other long term (current) drug therapy: Secondary | ICD-10-CM | POA: Diagnosis not present

## 2016-11-13 DIAGNOSIS — S0990XA Unspecified injury of head, initial encounter: Secondary | ICD-10-CM | POA: Diagnosis present

## 2016-11-13 DIAGNOSIS — W228XXA Striking against or struck by other objects, initial encounter: Secondary | ICD-10-CM | POA: Diagnosis not present

## 2016-11-13 DIAGNOSIS — J45909 Unspecified asthma, uncomplicated: Secondary | ICD-10-CM | POA: Insufficient documentation

## 2016-11-13 DIAGNOSIS — Y939 Activity, unspecified: Secondary | ICD-10-CM | POA: Insufficient documentation

## 2016-11-13 DIAGNOSIS — S060X0A Concussion without loss of consciousness, initial encounter: Secondary | ICD-10-CM

## 2016-11-13 MED ORDER — KETOROLAC TROMETHAMINE 30 MG/ML IJ SOLN
30.0000 mg | Freq: Once | INTRAMUSCULAR | Status: AC
  Administered 2016-11-13: 30 mg via INTRAVENOUS
  Filled 2016-11-13: qty 1

## 2016-11-13 MED ORDER — SODIUM CHLORIDE 0.9 % IV BOLUS (SEPSIS)
1000.0000 mL | Freq: Once | INTRAVENOUS | Status: AC
  Administered 2016-11-13: 1000 mL via INTRAVENOUS

## 2016-11-13 MED ORDER — METOCLOPRAMIDE HCL 5 MG/ML IJ SOLN
10.0000 mg | Freq: Once | INTRAMUSCULAR | Status: AC
  Administered 2016-11-13: 10 mg via INTRAVENOUS
  Filled 2016-11-13: qty 2

## 2016-11-13 MED ORDER — DIPHENHYDRAMINE HCL 50 MG/ML IJ SOLN
25.0000 mg | Freq: Once | INTRAMUSCULAR | Status: AC
  Administered 2016-11-13: 25 mg via INTRAVENOUS
  Filled 2016-11-13: qty 1

## 2016-11-13 NOTE — Discharge Instructions (Signed)
Your CT showed no evidence of bleeding or trauma to the brain.  However, you have been diagnosed with a concussion.  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  Return to the ER if your symptoms worsen.

## 2016-11-13 NOTE — ED Notes (Signed)
Pt transported to CT at this time.
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
## 2016-11-13 NOTE — ED Notes (Signed)
Pt A&OX4, ambulatory at d/c with steady gait, NAD 

## 2016-11-13 NOTE — ED Triage Notes (Signed)
Pt reports waking up with headache this am, has gotten worse throughout the day. Reports dizziness and n/v. Hx of migraines.
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
## 2016-11-20 ENCOUNTER — Other Ambulatory Visit: Payer: 59

## 2016-11-25 ENCOUNTER — Ambulatory Visit (INDEPENDENT_AMBULATORY_CARE_PROVIDER_SITE_OTHER): Payer: 59 | Admitting: Family Medicine

## 2016-11-25 ENCOUNTER — Encounter: Payer: Self-pay | Admitting: Family Medicine

## 2016-11-25 ENCOUNTER — Telehealth: Payer: Self-pay | Admitting: Family Medicine

## 2016-11-25 VITALS — BP 114/76 | HR 82 | Temp 98.6°F | Ht 70.0 in | Wt 159.4 lb

## 2016-11-25 DIAGNOSIS — M25512 Pain in left shoulder: Secondary | ICD-10-CM | POA: Diagnosis not present

## 2016-11-25 DIAGNOSIS — G8929 Other chronic pain: Secondary | ICD-10-CM | POA: Diagnosis not present

## 2016-11-25 DIAGNOSIS — F0781 Postconcussional syndrome: Secondary | ICD-10-CM

## 2016-11-25 MED ORDER — CYCLOBENZAPRINE HCL 10 MG PO TABS: 10.0000 mg | ORAL_TABLET | Freq: Three times a day (TID) | ORAL | 0 refills | Status: DC | PRN

## 2016-11-25 NOTE — Telephone Encounter (Signed)
Called patient to let him know I placed the letter he is requesting at the front desk for him to pick up at his convenience. I left a voicemail letting him know.

## 2016-11-25 NOTE — Progress Notes (Signed)
Pre visit review using our clinic review tool, if applicable. No additional management support is needed unless otherwise documented below in the visit note. 

## 2016-11-25 NOTE — Telephone Encounter (Signed)
Pt states his employer wants a note stating he DOES NOT have any restrictions when he returns to work tomorrow.  Pt needs a new note today please.

## 2016-11-25 NOTE — Patient Instructions (Signed)
I would try to minimize noise, tv, phone time as much as possible. Rest as much as able.   Going back to work may cause recurrence of symptoms but you stated you needed to- if you have worsening symptoms more than very mild headache please return to see me and we may need to take you out of work longer.   Refilled flexeril for the shoulder
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
## 2016-12-08 ENCOUNTER — Encounter: Payer: Self-pay | Admitting: Family Medicine

## 2016-12-08 ENCOUNTER — Ambulatory Visit (INDEPENDENT_AMBULATORY_CARE_PROVIDER_SITE_OTHER): Payer: 59 | Admitting: Family Medicine

## 2016-12-08 ENCOUNTER — Telehealth: Payer: Self-pay | Admitting: Family Medicine

## 2016-12-08 VITALS — BP 118/70 | HR 81 | Temp 98.4°F | Ht 70.0 in | Wt 164.8 lb

## 2016-12-08 DIAGNOSIS — M25512 Pain in left shoulder: Secondary | ICD-10-CM

## 2016-12-08 DIAGNOSIS — G8929 Other chronic pain: Secondary | ICD-10-CM | POA: Diagnosis not present

## 2016-12-08 DIAGNOSIS — F0781 Postconcussional syndrome: Secondary | ICD-10-CM | POA: Diagnosis not present

## 2016-12-08 MED ORDER — SUMATRIPTAN SUCCINATE 50 MG PO TABS: 50.0000 mg | ORAL_TABLET | ORAL | 2 refills | Status: AC | PRN

## 2016-12-08 MED ORDER — CYCLOBENZAPRINE HCL 10 MG PO TABS: 10.0000 mg | ORAL_TABLET | Freq: Three times a day (TID) | ORAL | 0 refills | Status: DC | PRN

## 2016-12-08 MED ORDER — NADOLOL 20 MG PO TABS: 20.0000 mg | ORAL_TABLET | Freq: Every day | ORAL | 5 refills | Status: AC

## 2016-12-08 NOTE — Progress Notes (Signed)
Pre visit review using our clinic review tool, if applicable. No additional management support is needed unless otherwise documented below in the visit note. 

## 2016-12-08 NOTE — Patient Instructions (Signed)
Glad you are doing better  Refilled all medicines  Would return to care if do not continue to slowly improve

## 2016-12-08 NOTE — Telephone Encounter (Signed)
Pt here in the office for a visit. Dr. Durene Cal aware and will print the prescription out and give to patient.
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
## 2016-12-08 NOTE — Telephone Encounter (Signed)
° ° °  Pt contacted [facility] and they do not have the below med.Will you resend please   cyclobenzaprine (FLEXERIL) 10 MG tablet    [facility] [place]

## 2017-02-17 ENCOUNTER — Encounter: Payer: Self-pay | Admitting: Family Medicine

## 2017-02-17 ENCOUNTER — Ambulatory Visit (INDEPENDENT_AMBULATORY_CARE_PROVIDER_SITE_OTHER): Payer: 59 | Admitting: Family Medicine

## 2017-02-17 VITALS — BP 104/62 | HR 65 | Temp 98.4°F | Ht 70.0 in | Wt 164.6 lb

## 2017-02-17 DIAGNOSIS — M25512 Pain in left shoulder: Secondary | ICD-10-CM | POA: Diagnosis not present

## 2017-02-17 DIAGNOSIS — G8929 Other chronic pain: Secondary | ICD-10-CM | POA: Diagnosis not present

## 2017-02-17 MED ORDER — DICLOFENAC SODIUM 75 MG PO TBEC
75.0000 mg | DELAYED_RELEASE_TABLET | Freq: Two times a day (BID) | ORAL | 0 refills | Status: DC
Start: 2017-02-17 — End: 2017-04-19

## 2017-02-17 MED ORDER — CYCLOBENZAPRINE HCL 10 MG PO TABS: 10.0000 mg | ORAL_TABLET | Freq: Three times a day (TID) | ORAL | 0 refills | Status: DC | PRN
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
## 2017-02-17 NOTE — Patient Instructions (Signed)
Refill flexeril. Trial voltaren twice a day for a week- take with food.   We will call you within a week or two about your referral to orthopedics. If you do not hear within 3 weeks, give us a call.

## 2017-02-23 ENCOUNTER — Emergency Department (HOSPITAL_COMMUNITY)
Admission: EM | Admit: 2017-02-23 | Discharge: 2017-02-23 | Disposition: A | Discharge status: Home / Self Care | Payer: 59 | Attending: Emergency Medicine | Admitting: Emergency Medicine

## 2017-02-23 ENCOUNTER — Encounter (HOSPITAL_COMMUNITY): Payer: Self-pay | Admitting: *Deleted

## 2017-02-23 ENCOUNTER — Ambulatory Visit (INDEPENDENT_AMBULATORY_CARE_PROVIDER_SITE_OTHER): Payer: 59 | Admitting: Orthopedic Surgery

## 2017-02-23 DIAGNOSIS — Z79899 Other long term (current) drug therapy: Secondary | ICD-10-CM | POA: Insufficient documentation

## 2017-02-23 DIAGNOSIS — G43909 Migraine, unspecified, not intractable, without status migrainosus: Secondary | ICD-10-CM | POA: Diagnosis not present

## 2017-02-23 DIAGNOSIS — G43001 Migraine without aura, not intractable, with status migrainosus: Secondary | ICD-10-CM | POA: Insufficient documentation

## 2017-02-23 DIAGNOSIS — J45909 Unspecified asthma, uncomplicated: Secondary | ICD-10-CM | POA: Insufficient documentation

## 2017-02-23 DIAGNOSIS — K0889 Other specified disorders of teeth and supporting structures: Secondary | ICD-10-CM

## 2017-02-23 MED ORDER — SODIUM CHLORIDE 0.9 % IV BOLUS (SEPSIS)
1000.0000 mL | Freq: Once | INTRAVENOUS | Status: AC
  Administered 2017-02-23: 1000 mL via INTRAVENOUS

## 2017-02-23 MED ORDER — PROCHLORPERAZINE EDISYLATE 5 MG/ML IJ SOLN
10.0000 mg | Freq: Once | INTRAMUSCULAR | Status: AC
  Administered 2017-02-23: 10 mg via INTRAVENOUS
  Filled 2017-02-23: qty 2

## 2017-02-23 MED ORDER — AMOXICILLIN 500 MG PO CAPS: 500.0000 mg | ORAL_CAPSULE | Freq: Three times a day (TID) | ORAL | 0 refills | Status: DC

## 2017-02-23 MED ORDER — KETOROLAC TROMETHAMINE 30 MG/ML IJ SOLN
30.0000 mg | Freq: Once | INTRAMUSCULAR | Status: AC
  Administered 2017-02-23: 30 mg via INTRAVENOUS
  Filled 2017-02-23: qty 1

## 2017-02-23 MED ORDER — DIPHENHYDRAMINE HCL 50 MG/ML IJ SOLN
25.0000 mg | Freq: Once | INTRAMUSCULAR | Status: AC
  Administered 2017-02-23: 25 mg via INTRAVENOUS
  Filled 2017-02-23: qty 1

## 2017-02-23 NOTE — ED Notes (Signed)
Pt ambulated to restroom with even steady gait.

## 2017-02-23 NOTE — ED Triage Notes (Signed)
Pt states L lower molar pain x 2 weeks and migraine x 2 days.
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
## 2017-02-23 NOTE — Discharge Instructions (Signed)
You are having a headache. No specific cause was found today for your headache. It may have been a migraine or other cause of headache. Stress, anxiety, fatigue, and depression are common triggers for headaches. Your headache today does not appear to be life-threatening or require hospitalization, but often the exact cause of headaches is not determined in the emergency department. Therefore, follow-up with your doctor is very important to find out what may have caused your headache, and whether or not you need any further diagnostic testing or treatment. Sometimes headaches can appear benign (not harmful), but then more serious symptoms can develop which should prompt an immediate re-evaluation by your doctor or the emergency department. SEEK MEDICAL ATTENTION IF: You develop possible problems with medications prescribed.  The medications don't resolve your headache, if it recurs , or if you have multiple episodes of vomiting or can't take fluids. You have a change from the usual headache. RETURN IMMEDIATELY IF you develop a sudden, severe headache or confusion, become poorly responsive or faint, develop a fever above 100.4F or problem breathing, have a change in speech, vision, swallowing, or understanding, or develop new weakness, numbness, tingling, incoordination, or have a seizure.  You have been seen by your caregiver because of dental pain.  SEEK MEDICAL ATTENTION IF: The exam and treatment you received today has been provided on an emergency basis only. This is not a substitute for complete medical or dental care. If your problem worsens or new symptoms (problems) appear, and you are unable to arrange prompt follow-up care with your dentist, call or return to this location. CALL YOUR DENTIST OR RETURN IMMEDIATELY IF you develop a fever, rash, difficulty breathing or swallowing, neck or facial swelling, or other potentially serious concerns.  

## 2017-03-07 ENCOUNTER — Telehealth: Payer: Self-pay

## 2017-03-07 NOTE — Telephone Encounter (Signed)
[facility] a call from [facility]. Tom Patterson is currently [place] in [place] and did not take any of his medications with him. [facility] so I informed them of the follow ing meds. They will give him a 10 day supply as he will be back in 10 days.  nadolol (CORGARD) 20 MG tab SUMAtriptan (IMITREX) 50 MG tablet

## 2017-03-08 NOTE — Telephone Encounter (Signed)
Yes thanks for FYI and for helping out Tom Patterson

## 2017-04-19 ENCOUNTER — Encounter: Payer: Self-pay | Admitting: Family Medicine

## 2017-04-19 ENCOUNTER — Ambulatory Visit (INDEPENDENT_AMBULATORY_CARE_PROVIDER_SITE_OTHER): Payer: 59 | Admitting: Family Medicine

## 2017-04-19 DIAGNOSIS — R55 Syncope and collapse: Secondary | ICD-10-CM | POA: Insufficient documentation

## 2017-04-19 DIAGNOSIS — G8929 Other chronic pain: Secondary | ICD-10-CM | POA: Insufficient documentation

## 2017-04-19 DIAGNOSIS — M25512 Pain in left shoulder: Secondary | ICD-10-CM | POA: Diagnosis not present
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
## 2017-04-19 NOTE — Assessment & Plan Note (Signed)
S: Patient presents for me to fill out a police form that he is safe to participate in high level workout/tryout with them. Looking back patient never completed workup for unexplained syncope with neurology and did not follow up in 2 months or less as instructed.  A/P: I discussed with patient could not clear him until neurology evaluates him since he has not had completed workup- no recurrence is reassuring but I do want Dr. Eliane DecreePatel's opinion before releasing him. There was also question of cardiology evaluation and may need to refer to cardiology depending on Dr. Eliane DecreePatel's findings

## 2017-05-02 ENCOUNTER — Ambulatory Visit (INDEPENDENT_AMBULATORY_CARE_PROVIDER_SITE_OTHER): Payer: 59 | Admitting: Physician Assistant

## 2017-05-02 ENCOUNTER — Encounter: Payer: Self-pay | Admitting: Physician Assistant

## 2017-05-02 VITALS — BP 134/78 | HR 82 | Temp 97.8°F | Resp 16 | Ht 70.0 in | Wt 167.4 lb

## 2017-05-02 DIAGNOSIS — G43801 Other migraine, not intractable, with status migrainosus: Secondary | ICD-10-CM | POA: Diagnosis not present

## 2017-05-02 LAB — URINALYSIS, DIPSTICK ONLY
BILIRUBIN UA: NEGATIVE
GLUCOSE, UA: NEGATIVE
Ketones, UA: NEGATIVE
Leukocytes, UA: NEGATIVE
Nitrite, UA: NEGATIVE
PROTEIN UA: NEGATIVE
RBC UA: NEGATIVE
Specific Gravity, UA: 1.024 (ref 1.005–1.030)
UUROB: 0.2 mg/dL (ref 0.2–1.0)
pH, UA: 7 (ref 5.0–7.5)

## 2017-05-02 LAB — CBC
Hematocrit: 43.9 % (ref 37.5–51.0)
Hemoglobin: 15.9 g/dL (ref 13.0–17.7)
MCH: 31.9 pg (ref 26.6–33.0)
MCHC: 36.2 g/dL — ABNORMAL HIGH (ref 31.5–35.7)
MCV: 88 fL (ref 79–97)
PLATELETS: 168 10*3/uL (ref 150–379)
RBC: 4.98 x10E6/uL (ref 4.14–5.80)
RDW: 12.8 % (ref 12.3–15.4)
WBC: 4.8 10*3/uL (ref 3.4–10.8)

## 2017-05-02 NOTE — Progress Notes (Deleted)
    05/02/2017 9:03 AM   DOB: [birth year] / MRN: [id number]  SUBJECTIVE:  Tom Patterson is a 21 y.o. male presenting for   He has No Known Allergies.   He  has a past medical history of Asthma and Migraine.    He  reports that he has never smoked. He has never used smokeless tobacco. He reports that he does not drink alcohol or use drugs. He  reports that he does not engage in sexual activity. The patient  has a past surgical history that includes none.  His family history includes ADD / ADHD in his brother; Healthy in his mother; Heart murmur in his mother; Stroke (age of onset: 54) in his father.  ROS  The problem list and medications were reviewed and updated by myself where necessary and exist elsewhere in the encounter.   OBJECTIVE:  BP 134/78 (BP Location: Right Arm, Patient Position: Sitting, Cuff Size: Normal)   Pulse 82   Temp 97.8 F (36.6 C) (Oral)   Resp 16   Ht  (1.778 m)   Wt 167 lb 6.4 oz (75.9 kg)   SpO2 98%   BMI 24.02 kg/m   Physical Exam  No results found for this or any previous visit (from the past 72 hour(s)).  No results found.  ASSESSMENT AND PLAN:  There are no diagnoses linked to this encounter.  The patient is advised to call or return to clinic if he does not see an improvement in symptoms, or to seek the care of the closest emergency department if he worsens with the above plan.   Deliah Boston, MHS, PA-C Primary Care at [facility] 05/02/2017 9:03 AM

## 2017-05-02 NOTE — Patient Instructions (Signed)
     IF you received an x-ray today, you will receive an invoice from [facility]. Please contact [facility] at [phone] with questions or concerns regarding your invoice.   IF you received labwork today, you will receive an invoice from [facility]. Please contact [facility] at [phone] with questions or concerns regarding your invoice.   Our billing staff will not be able to assist you with questions regarding bills from these companies.  You will be contacted with the lab results as soon as they are available. The fastest way to get your results is to activate your My Chart account. Instructions are located on the last page of this paperwork. If you have not heard from us regarding the results in 2 weeks, please contact this office.     

## 2017-05-02 NOTE — Progress Notes (Signed)
05/02/2017 9:16 AM   DOB: 1996-05-11 / MRN: [id number]  SUBJECTIVE:  Tom Patterson is a 21 y.o. male presenting for history of migraines with sensitivity to light and sound.Tells me he is still functional with a migraine.   Tells me this is well controlled with Imitrex.  Takes corgaurd for headaches as well.  Gets one to two migraines a month.   No family history of sudden cardiac death. No difficulty with physical training. Wants to be a Engineer, structural and would like have his POPAT form signed today.   Immunization History  Administered Date(s) Administered  . DTaP 11/05/1996, 01/01/1997, 03/04/1997, 10/29/1997, 09/26/2000  . HPV 9-valent 12/05/2014  . HPV Quadrivalent 04/24/2013  . Hepatitis A, Ped/Adol-2 Dose 04/24/2013, 12/05/2014  . Hepatitis B 11/23/1996, 11/05/1996, 03/04/1997  . HiB (PRP-OMP) 11/05/1996, 01/01/1997, 03/04/1997, 10/29/1997  . IPV 11/05/1996, 01/01/1997, 10/29/1997, 09/26/2000  . Influenza,inj,quad, With Preservative 07/20/2013  . MMR 10/29/1997, 09/26/2000  . Meningococcal Conjugate 04/24/2013  . Td 03/28/2008  . Tdap 03/28/2008  . Varicella 10/29/1997, 04/24/2013     He has No Known Allergies.   He  has a past medical history of Asthma and Migraine.    He  reports that he has never smoked. He has never used smokeless tobacco. He reports that he does not drink alcohol or use drugs. He  reports that he does not engage in sexual activity. The patient  has a past surgical history that includes none.  His family history includes ADD / ADHD in his brother; Healthy in his mother; Heart murmur in his mother; Stroke (age of onset: 92) in his father.  Review of Systems  Constitutional: Negative for chills, diaphoresis and fever.  Eyes: Negative.   Respiratory: Negative for cough, hemoptysis, sputum production, shortness of breath and wheezing.   Cardiovascular: Negative for chest pain, orthopnea and leg swelling.  Gastrointestinal: Negative for nausea.    Skin: Negative for rash.  Neurological: Negative for dizziness, sensory change, speech change, focal weakness and headaches.    The problem list and medications were reviewed and updated by myself where necessary and exist elsewhere in the encounter.   OBJECTIVE:  BP 134/78 (BP Location: Right Arm, Patient Position: Sitting, Cuff Size: Normal)   Pulse 82   Temp 97.8 F (36.6 C) (Oral)   Resp 16   Ht '5\' 10"'  (1.778 m)   Wt 167 lb 6.4 oz (75.9 kg)   SpO2 98%   BMI 24.02 kg/m   Physical Exam  Constitutional: He is oriented to person, place, and time. He appears well-developed. He is active and cooperative.  Non-toxic appearance.  Eyes: Pupils are equal, round, and reactive to light. EOM are normal.  Cardiovascular: Normal rate, regular rhythm, S1 normal, S2 normal, normal heart sounds, intact distal pulses and normal pulses.  Exam reveals no gallop and no friction rub.   No murmur heard. Pulmonary/Chest: Effort normal. No stridor. No tachypnea. No respiratory distress. He has no wheezes. He has no rales.  Abdominal: He exhibits no distension.  Musculoskeletal: Normal range of motion. He exhibits no edema or deformity.  Neurological: He is alert and oriented to person, place, and time. He has normal strength and normal reflexes. He is not disoriented. No cranial nerve deficit or sensory deficit. He exhibits normal muscle tone. Coordination and gait normal.  Skin: Skin is warm and dry. No rash noted. He is not diaphoretic. No erythema. No pallor.  Psychiatric: His behavior is normal.  Vitals reviewed.  Visual Acuity Screening   Right eye Left eye Both eyes  Without correction:     With correction: '20/25 20/25 20/25 '     No results found for this or any previous visit (from the past 72 hour(s)).  No results found.  ASSESSMENT AND PLAN:  Tom Patterson was seen today for annual exam.  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  Neuro intact. No difficulty with any form of physical training.  Will screen a CBC and urine. He is cleared to participate in the popat.  Orders: -     Urinalysis, dipstick only -     CBC    The patient is advised to call or return to clinic if he does not see an improvement in symptoms, or to seek the care of the closest emergency department if he worsens with the above plan.   Philis Fendt, MHS, PA-C [facility] 05/02/2017 9:16 AM

## 2017-05-05 NOTE — Progress Notes (Signed)
Thank you for the message. I am sorry to hear this.  In discussion of his headaches and neurological history he was not forthcoming with this information. I will bring the patient back in as well as the contact the [facility] police department and rescind his clearance until evaluated by Dr. Allena Katz.   Sherese please call the patient and advise that he return to clinic so I may speak with him. Deliah Boston, MS, PA-C 10:34 AM, 05/05/2017

## 2017-05-05 NOTE — Progress Notes (Signed)
Spoke with patient as requested, patient given appointment for next week./ S.Darianny Momon,CMA
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
## 2017-05-11 ENCOUNTER — Ambulatory Visit: Payer: 59 | Admitting: Physician Assistant
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
## 2017-08-05 ENCOUNTER — Ambulatory Visit: Payer: 59 | Admitting: Neurology

## 2017-09-19 ENCOUNTER — Ambulatory Visit (HOSPITAL_COMMUNITY): Admission: EM | Admit: 2017-09-19 | Discharge: 2017-09-19 | Discharge status: Against Medical Advice | Payer: 59

## 2017-09-19 NOTE — ED Triage Notes (Signed)
Per pt access pt left 

## 2017-10-20 ENCOUNTER — Other Ambulatory Visit: Payer: Self-pay

## 2017-10-20 ENCOUNTER — Emergency Department (HOSPITAL_COMMUNITY)
Admission: EM | Admit: 2017-10-20 | Discharge: 2017-10-20 | Disposition: A | Discharge status: Home / Self Care | Payer: 59 | Attending: Emergency Medicine | Admitting: Emergency Medicine

## 2017-10-20 ENCOUNTER — Emergency Department (HOSPITAL_COMMUNITY): Payer: 59

## 2017-10-20 ENCOUNTER — Encounter (HOSPITAL_COMMUNITY): Payer: Self-pay | Admitting: Emergency Medicine

## 2017-10-20 DIAGNOSIS — J45909 Unspecified asthma, uncomplicated: Secondary | ICD-10-CM | POA: Diagnosis not present

## 2017-10-20 DIAGNOSIS — Z79899 Other long term (current) drug therapy: Secondary | ICD-10-CM | POA: Insufficient documentation

## 2017-10-20 DIAGNOSIS — M79671 Pain in right foot: Secondary | ICD-10-CM | POA: Insufficient documentation

## 2017-10-20 MED ORDER — NAPROXEN 500 MG PO TABS: 500.0000 mg | ORAL_TABLET | Freq: Two times a day (BID) | ORAL | 0 refills | Status: DC | PRN

## 2017-10-20 NOTE — ED Provider Notes (Signed)
[facility] Note   CSN: [id number] Arrival date & time: 10/20/17  0222     History   Chief Complaint Chief Complaint  Patient presents with  . Foot Pain    HPI Tom Patterson is a 22 y.o. male.  The history is provided by the patient and medical records. No language interpreter was used.  Foot Pain    Tom BurlyBrice J Patterson is a 22 y.o. male  who presents to the Emergency Department complaining of acute onset of right foot pain after trying to break a stick with his right foot last night.  Pain has progressively worsened throughout the night.  He was initially able to bear weight, but now states the pain is so severe that he does not want to put pressure on the foot.  Denies any open wounds.  No numbness or tingling.  Past Medical History:  Diagnosis Date  . Asthma    childhood  . Migraine    Pediatrician diagnosed. Was on medication before. about once a month. Left frontal, throbbing, debilitating. Lasts for hours- eventually goes away.     Patient Active Problem List   Diagnosis Date Noted  . Syncope 04/19/2017  . 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 Hypoglycemia 09/07/2016  . Migraine   . Asthma 04/25/2013    Past Surgical History:  Procedure Laterality Date  . none         Home Medications    Prior to Admission medications   Medication Sig Start Date End Date Taking? Authorizing Provider  nadolol (CORGARD) 20 MG tablet Take 1 tablet (20 mg total) by mouth daily. 12/08/16   Shelva MajesticHunter, Stephen O, MD  naproxen (NAPROSYN) 500 MG tablet Take 1 tablet (500 mg total) by mouth 2 (two) times daily as needed. 10/20/17   Birdie Fetty, Chase PicketJaime Pilcher, PA-C  SUMAtriptan (IMITREX) 50 MG tablet Take 1 tablet (50 mg total) by mouth every 2 (two) hours as needed for migraine (2 per day maximum). Repeat in 2 hours if headache recurs 12/08/16   Shelva MajesticHunter, Stephen O, MD    Family History Family History  Problem Relation Age of Onset  . Healthy  Mother   . Heart murmur Mother   . Stroke Father [id number]       stress related reportedly. states no meds  . ADD / ADHD Brother     Social History Social History   Tobacco Use  . Smoking status: Never Smoker  . Smokeless tobacco: Never Used  Substance Use Topics  . Alcohol use: No  . Drug use: No     Allergies   Patient has no known allergies.   Review of Systems Review of Systems  Musculoskeletal: Positive for myalgias.  Skin: Negative for wound.  Neurological: Negative for weakness and numbness.     Physical Exam Updated Vital Signs BP 132/81 (BP Location: Right Arm)   Pulse 72   Temp 98.1 F (36.7 C) (Oral)   Resp 16   Ht 5\' 10"  (1.778 m)   Wt 77.1 kg (170 lb)   SpO2 100%   BMI 24.39 kg/m   Physical Exam  Constitutional: He appears well-developed and well-nourished. No distress.  HENT:  Head: Normocephalic and atraumatic.  Neck: Neck supple.  Cardiovascular: Normal rate, regular rhythm and normal heart sounds.  No murmur heard. Pulmonary/Chest: Effort normal and breath sounds normal. No respiratory distress. He has no wheezes. He has no rales.  Musculoskeletal:  Tenderness to palpation of the plantar  aspect of the right foot.  No open wounds or overlying skin changes.  Sensation intact.  2+ DP.  Full range of motion without any pain or difficulty.  Neurological: He is alert.  Skin: Skin is warm and dry.  Nursing note and vitals reviewed.    ED Treatments / Results  Labs (all labs ordered are listed, but only abnormal results are displayed) Labs Reviewed - No data to display  EKG  EKG Interpretation None       Radiology Dg Foot Complete Right  Result Date: 10/20/2017 CLINICAL DATA:  22 year old male with right foot pain. EXAM: RIGHT FOOT COMPLETE - 3+ VIEW COMPARISON:  None. FINDINGS: There is no evidence of fracture or dislocation. There is no evidence of arthropathy or other focal bone abnormality. Soft tissues are unremarkable. IMPRESSION:  Negative. Electronically Signed   By: Elgie Collard M.D.   On: 10/20/2017 03:00    Procedures Procedures (including critical care time)  Medications Ordered in ED Medications - No data to display   Initial Impression / Assessment and Plan / ED Course  I have reviewed the triage vital signs and the nursing notes.  Pertinent labs & imaging results that were available during my care of the patient were reviewed by me and considered in my medical decision making (see chart for details).    Tom Patterson is a 22 y.o. male who presents to ED for right foot pain after trying to break a stick with the right foot last night.  X-ray negative.  Neurovascularly intact on exam.  No open wounds.  Will treat symptomatically and have patient follow-up with either PCP or podiatry if symptoms are not improving.  All questions answered.   Final Clinical Impressions(s) / ED Diagnoses   Final diagnoses:  Foot pain, right    ED Discharge Orders        Ordered    naproxen (NAPROSYN) 500 MG tablet  2 times daily PRN     10/20/17 0830       Joyanne Eddinger, Chase Picket, PA-C 10/20/17 1610    Arby Barrette, MD 10/21/17 1545

## 2017-10-20 NOTE — Discharge Instructions (Signed)
It was my pleasure taking care of you today!   Naproxen as needed for pain.   Rest, ice.   If no improvement in 1 week, follow up with your primary care doctor or the podiatrist (foot doctor) listed.   Return to ER for new or worsening symptoms, any additional concerns.

## 2017-10-20 NOTE — ED Triage Notes (Signed)
Pt reports R foot pain after trying to break a stick. Pt states pain is to the bottom of the foot. No swelling, bruising, etc.

## 2017-11-11 ENCOUNTER — Encounter (HOSPITAL_COMMUNITY): Payer: Self-pay | Admitting: *Deleted

## 2017-11-11 ENCOUNTER — Emergency Department (HOSPITAL_COMMUNITY)
Admission: EM | Admit: 2017-11-11 | Discharge: 2017-11-12 | Disposition: A | Discharge status: Home / Self Care | Payer: 59 | Attending: Emergency Medicine | Admitting: Emergency Medicine

## 2017-11-11 ENCOUNTER — Other Ambulatory Visit: Payer: Self-pay

## 2017-11-11 DIAGNOSIS — Z5321 Procedure and treatment not carried out due to patient leaving prior to being seen by health care provider: Secondary | ICD-10-CM | POA: Insufficient documentation

## 2017-11-11 DIAGNOSIS — R51 Headache: Secondary | ICD-10-CM | POA: Insufficient documentation
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
## 2017-11-12 NOTE — ED Notes (Signed)
No answer for vitals recheck

## 2017-12-07 DIAGNOSIS — S60462A Insect bite (nonvenomous) of right middle finger, initial encounter: Secondary | ICD-10-CM | POA: Diagnosis not present

## 2017-12-07 DIAGNOSIS — T63301A Toxic effect of unspecified spider venom, accidental (unintentional), initial encounter: Secondary | ICD-10-CM | POA: Diagnosis not present

## 2018-01-28 ENCOUNTER — Emergency Department (HOSPITAL_COMMUNITY)
Admission: EM | Admit: 2018-01-28 | Discharge: 2018-01-28 | Disposition: A | Discharge status: Home / Self Care | Payer: 59 | Attending: Physician Assistant | Admitting: Physician Assistant

## 2018-01-28 ENCOUNTER — Emergency Department (HOSPITAL_COMMUNITY): Payer: 59

## 2018-01-28 ENCOUNTER — Other Ambulatory Visit: Payer: Self-pay

## 2018-01-28 ENCOUNTER — Encounter (HOSPITAL_COMMUNITY): Payer: Self-pay | Admitting: *Deleted

## 2018-01-28 DIAGNOSIS — Z79899 Other long term (current) drug therapy: Secondary | ICD-10-CM | POA: Diagnosis not present

## 2018-01-28 DIAGNOSIS — J45909 Unspecified asthma, uncomplicated: Secondary | ICD-10-CM | POA: Insufficient documentation

## 2018-01-28 DIAGNOSIS — S161XXA Strain of muscle, fascia and tendon at neck level, initial encounter: Secondary | ICD-10-CM | POA: Insufficient documentation

## 2018-01-28 DIAGNOSIS — Y998 Other external cause status: Secondary | ICD-10-CM | POA: Insufficient documentation

## 2018-01-28 DIAGNOSIS — Y939 Activity, unspecified: Secondary | ICD-10-CM | POA: Diagnosis not present

## 2018-01-28 DIAGNOSIS — Y92410 Unspecified street and highway as the place of occurrence of the external cause: Secondary | ICD-10-CM | POA: Diagnosis not present

## 2018-01-28 DIAGNOSIS — S199XXA Unspecified injury of neck, initial encounter: Secondary | ICD-10-CM | POA: Diagnosis present

## 2018-01-28 MED ORDER — IBUPROFEN 800 MG PO TABS: 800.0000 mg | ORAL_TABLET | Freq: Three times a day (TID) | ORAL | 0 refills | Status: DC | PRN

## 2018-01-28 MED ORDER — CYCLOBENZAPRINE HCL 10 MG PO TABS: 10.0000 mg | ORAL_TABLET | Freq: Every day | ORAL | 0 refills | Status: DC

## 2018-01-28 MED ORDER — TRAMADOL HCL 50 MG PO TABS: 50.0000 mg | ORAL_TABLET | Freq: Four times a day (QID) | ORAL | 0 refills | Status: DC | PRN

## 2018-01-28 NOTE — ED Notes (Signed)
Dad sent to adult waiting, nurse first aware

## 2018-01-28 NOTE — Discharge Instructions (Signed)
Return here for any worsening in your condition.  Apply ice and heat to the area that is sore.  The CT scans did not show any significant abnormalities at this time.
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
## 2018-02-19 ENCOUNTER — Encounter (HOSPITAL_COMMUNITY): Payer: Self-pay

## 2018-02-19 ENCOUNTER — Emergency Department (HOSPITAL_COMMUNITY)
Admission: EM | Admit: 2018-02-19 | Discharge: 2018-02-19 | Disposition: A | Discharge status: Home / Self Care | Payer: 59 | Attending: Emergency Medicine | Admitting: Emergency Medicine

## 2018-02-19 ENCOUNTER — Emergency Department (HOSPITAL_COMMUNITY): Payer: 59

## 2018-02-19 ENCOUNTER — Other Ambulatory Visit: Payer: Self-pay

## 2018-02-19 DIAGNOSIS — Y92410 Unspecified street and highway as the place of occurrence of the external cause: Secondary | ICD-10-CM | POA: Diagnosis not present

## 2018-02-19 DIAGNOSIS — S63501A Unspecified sprain of right wrist, initial encounter: Secondary | ICD-10-CM

## 2018-02-19 DIAGNOSIS — J45909 Unspecified asthma, uncomplicated: Secondary | ICD-10-CM | POA: Insufficient documentation

## 2018-02-19 DIAGNOSIS — Y998 Other external cause status: Secondary | ICD-10-CM | POA: Insufficient documentation

## 2018-02-19 DIAGNOSIS — Z79899 Other long term (current) drug therapy: Secondary | ICD-10-CM | POA: Diagnosis not present

## 2018-02-19 DIAGNOSIS — W19XXXA Unspecified fall, initial encounter: Secondary | ICD-10-CM

## 2018-02-19 DIAGNOSIS — S6991XA Unspecified injury of right wrist, hand and finger(s), initial encounter: Secondary | ICD-10-CM | POA: Diagnosis present

## 2018-02-19 DIAGNOSIS — Y9355 Activity, bike riding: Secondary | ICD-10-CM | POA: Insufficient documentation

## 2018-02-19 MED ORDER — IBUPROFEN 800 MG PO TABS: 800.0000 mg | ORAL_TABLET | Freq: Three times a day (TID) | ORAL | 0 refills | Status: DC | PRN
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
## 2018-02-19 NOTE — Discharge Instructions (Signed)
Your wrist x-rays did not show any broken bones at this time.  Ice and elevate the wrist.  Return here for any worsening in your condition.  I would be reassessed once you are [place] [employer].
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
## 2018-04-21 ENCOUNTER — Other Ambulatory Visit: Payer: Self-pay

## 2018-04-21 ENCOUNTER — Emergency Department
Admission: EM | Admit: 2018-04-21 | Discharge: 2018-04-21 | Disposition: A | Discharge status: Home / Self Care | Payer: 59 | Attending: Emergency Medicine | Admitting: Emergency Medicine

## 2018-04-21 ENCOUNTER — Encounter: Payer: Self-pay | Admitting: Emergency Medicine

## 2018-04-21 DIAGNOSIS — S0990XA Unspecified injury of head, initial encounter: Secondary | ICD-10-CM

## 2018-04-21 DIAGNOSIS — G44319 Acute post-traumatic headache, not intractable: Secondary | ICD-10-CM

## 2018-04-21 DIAGNOSIS — J45909 Unspecified asthma, uncomplicated: Secondary | ICD-10-CM | POA: Insufficient documentation

## 2018-04-21 DIAGNOSIS — Y929 Unspecified place or not applicable: Secondary | ICD-10-CM | POA: Insufficient documentation

## 2018-04-21 DIAGNOSIS — Y999 Unspecified external cause status: Secondary | ICD-10-CM | POA: Diagnosis not present

## 2018-04-21 DIAGNOSIS — Z79899 Other long term (current) drug therapy: Secondary | ICD-10-CM | POA: Insufficient documentation

## 2018-04-21 DIAGNOSIS — W01198A Fall on same level from slipping, tripping and stumbling with subsequent striking against other object, initial encounter: Secondary | ICD-10-CM | POA: Diagnosis not present

## 2018-04-21 DIAGNOSIS — Y939 Activity, unspecified: Secondary | ICD-10-CM | POA: Diagnosis not present

## 2018-04-21 MED ORDER — KETOROLAC TROMETHAMINE 30 MG/ML IJ SOLN
30.0000 mg | Freq: Once | INTRAMUSCULAR | Status: AC
  Administered 2018-04-21: 30 mg via INTRAMUSCULAR
  Filled 2018-04-21: qty 1

## 2018-04-21 MED ORDER — KETOROLAC TROMETHAMINE 30 MG/ML IJ SOLN: 30.0000 mg | Freq: Once | INTRAMUSCULAR | Status: DC
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
## 2018-04-21 NOTE — ED Notes (Signed)
No obvious contusions or swelling noted during assessment. Pt states hit head against metal door. Denies any burred or double vision at this time. States he just feels like its a dull headache at this time

## 2018-04-21 NOTE — ED Triage Notes (Addendum)
First Nurse Note:  Slipped off ladder today, hit head.  No LOC.  [facility] [facility] [place].  Patient is AAOx3.  Skin warm and dry. NAD.  MAE equally and strong.  Ambulates with easy and steady gait.  Posture upright and relaxed.

## 2018-04-21 NOTE — Discharge Instructions (Signed)
You were seen and treated for a post traumatic headache. We gave you a Toradol injection today. You can take Tylenol 1000 mg every 8 hours as needed for headache. Return to ER if you develop worsening headache, persistent dizziness, visual changes, confusion, nausea or vomiting.
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
## 2021-05-31 ENCOUNTER — Emergency Department (HOSPITAL_COMMUNITY)
Admission: EM | Admit: 2021-05-31 | Discharge: 2021-05-31 | Disposition: A | Discharge status: Hospice | Payer: 59 | Attending: Emergency Medicine | Admitting: Emergency Medicine

## 2021-05-31 ENCOUNTER — Emergency Department (HOSPITAL_COMMUNITY): Payer: 59

## 2021-05-31 ENCOUNTER — Encounter (HOSPITAL_COMMUNITY): Payer: Self-pay

## 2021-05-31 DIAGNOSIS — M25511 Pain in right shoulder: Secondary | ICD-10-CM | POA: Diagnosis not present

## 2021-05-31 DIAGNOSIS — I10 Essential (primary) hypertension: Secondary | ICD-10-CM | POA: Insufficient documentation

## 2021-05-31 DIAGNOSIS — M25512 Pain in left shoulder: Secondary | ICD-10-CM | POA: Diagnosis not present

## 2021-05-31 DIAGNOSIS — M62838 Other muscle spasm: Secondary | ICD-10-CM

## 2021-05-31 DIAGNOSIS — J45909 Unspecified asthma, uncomplicated: Secondary | ICD-10-CM | POA: Insufficient documentation

## 2021-05-31 DIAGNOSIS — Z79899 Other long term (current) drug therapy: Secondary | ICD-10-CM | POA: Diagnosis not present

## 2021-05-31 LAB — BASIC METABOLIC PANEL
Anion gap: 8 (ref 5–15)
BUN: 8 mg/dL (ref 6–20)
CO2: 26 mmol/L (ref 22–32)
Calcium: 9.2 mg/dL (ref 8.9–10.3)
Chloride: 101 mmol/L (ref 98–111)
Creatinine, Ser: 0.96 mg/dL (ref 0.61–1.24)
GFR, Estimated: >60 mL/min (ref >60)
Glucose, Bld: 111 mg/dL — ABNORMAL HIGH (ref 70–99)
Potassium: 3.8 mmol/L (ref 3.5–5.1)
Sodium: 135 mmol/L (ref 135–145)

## 2021-05-31 LAB — CBC
HCT: 47.6 % (ref 39.0–52.0)
Hemoglobin: 16.4 g/dL (ref 13.0–17.0)
MCH: 30.8 pg (ref 26.0–34.0)
MCHC: 34.5 g/dL (ref 30.0–36.0)
MCV: 89.3 fL (ref 80.0–100.0)
Platelets: 184 10*3/uL (ref 150–400)
RBC: 5.33 MIL/uL (ref 4.22–5.81)
RDW: 11.4 % — ABNORMAL LOW (ref 11.5–15.5)
WBC: 6.1 10*3/uL (ref 4.0–10.5)
nRBC: 0 % (ref 0.0–0.2)

## 2021-05-31 MED ORDER — CYCLOBENZAPRINE HCL 10 MG PO TABS
10.0000 mg | ORAL_TABLET | Freq: Once | ORAL | Status: AC
  Administered 2021-05-31: 10 mg via ORAL
  Filled 2021-05-31: qty 1

## 2021-05-31 MED ORDER — IBUPROFEN 800 MG PO TABS
800.0000 mg | ORAL_TABLET | Freq: Once | ORAL | Status: AC
  Administered 2021-05-31: 800 mg via ORAL
  Filled 2021-05-31: qty 1

## 2021-05-31 NOTE — ED Notes (Signed)
Patient transported to CT 

## 2021-05-31 NOTE — ED Triage Notes (Signed)
Pt reports that he woke up yesterday with L shoulder pain, denies heavy lifting or injury

## 2021-05-31 NOTE — Discharge Instructions (Signed)
Your shoulder pain appears to be muscular in nature Your blood pressure is significantly elevated today. Please recheck with your primary care physician tomorrow for recheck of blood pressure and possible beginning of medication

## 2021-05-31 NOTE — ED Notes (Signed)
Patient transported to X-ray 

## 2021-05-31 NOTE — ED Provider Notes (Signed)
[facility]PART[facility] Provider Note   CSN: [id number] Arrival date & time: 05/31/21  0443     History Chief Complaint  Patient presents with   Shoulder Pain    Tom Patterson is a 25 y.o. male.  HPI 25 year old male presents today complaining of shoulder pain.  He says it started with his left shoulder and then began in his right shoulder.  It is worse with movement.  He denies any dyspnea.  It began last night he states that the pain kept him up all night.  He identified as being in the trapezius area.  Denies any past medical history.  He states that his blood pressure has most recently been checked at work back in January.  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  He does not take any medications.  He denies smoking, alcohol, or illicit drug use.  Denies having done any unusual activity or had any injury.    Past Medical History:  Diagnosis Date   Asthma    childhood   Migraine    Pediatrician diagnosed. Was on medication before. about once a month. Left frontal, throbbing, debilitating. Lasts for hours- eventually goes away.     Patient Active Problem List   Diagnosis Date Noted   Syncope 04/19/2017   Chronic left shoulder pain 04/19/2017   Hypoglycemia 09/07/2016   Migraine    Asthma 04/25/2013    Past Surgical History:  Procedure Laterality Date   none         Family History  Problem Relation Age of Onset   Healthy Mother    Heart murmur Mother    Stroke Father 39       stress related reportedly. states no meds   ADD / ADHD Brother     Social History   Tobacco Use   Smoking status: Never   Smokeless tobacco: Never  Vaping Use   Vaping Use: Never used  Substance Use Topics   Alcohol use: No   Drug use: No    Home Medications Prior to Admission medications   Medication Sig Start Date End Date Taking? Authorizing Provider  nadolol (CORGARD) 20 MG tablet Take 1 tablet  (20 mg total) by mouth daily. 12/08/16   Marin Olp, MD  SUMAtriptan (IMITREX) 50 MG tablet Take 1 tablet (50 mg total) by mouth every 2 (two) hours as needed for migraine (2 per day maximum). Repeat in 2 hours if headache recurs 12/08/16   Marin Olp, MD    Allergies    Patient has no known allergies.  Review of Systems   Review of Systems  All other systems reviewed and are negative.  Physical Exam Updated Vital Signs BP (!) 154/111 (BP Location: Left Arm)   Pulse 73   Temp 98.9 F (37.2 C)   Resp 15   SpO2 100%   Physical Exam Vitals and nursing note reviewed.  Constitutional:      Appearance: Normal appearance. He is normal weight.     Comments: Initial blood pressure was noted as elevated.  This was rechecked with the patient laying down and arm at his side and blood pressure is 154/111  HENT:     Head: Normocephalic.     Right Ear: External ear normal.     Left Ear: External ear normal.     Nose: Nose normal.     Mouth/Throat:     Pharynx: Oropharynx is clear.  Eyes:     Pupils: Pupils are equal, round, and reactive to light.  Cardiovascular:     Rate and Rhythm: Normal rate and regular rhythm.  Pulmonary:     Effort: Pulmonary effort is normal.     Breath sounds: Normal breath sounds.  Abdominal:     Palpations: Abdomen is soft.  Musculoskeletal:        General: Normal range of motion.     Cervical back: Normal range of motion.     Comments: No external signs of trauma there is some tenderness over bilateral trapezius muscle.  Skin:    General: Skin is warm and dry.  Neurological:     General: No focal deficit present.     Mental Status: He is alert.  Psychiatric:        Mood and Affect: Mood normal.    ED Results / Procedures / Treatments   Labs (all labs ordered are listed, but only abnormal results are displayed) Labs Reviewed  CBC  BASIC METABOLIC PANEL    EKG None  Radiology DG Shoulder Left  Result Date: 05/31/2021 CLINICAL  DATA:  25 year old male with left shoulder pain upon waking yesterday. No known injury. EXAM: LEFT SHOULDER - 2+ VIEW COMPARISON:  None. FINDINGS: Bone mineralization is within normal limits. There is no evidence of fracture or dislocation. There is no evidence of arthropathy or other focal bone abnormality. Negative visible left chest and ribs. IMPRESSION: Negative. Electronically Signed   By: Genevie Ann M.D.   On: 05/31/2021 06:11    Procedures Procedures   Medications Ordered in ED Medications - No data to display  ED Course  I have reviewed the triage vital signs and the nursing notes.  Pertinent labs & imaging results that were available during my care of the patient were reviewed by me and considered in my medical decision making (see chart for details).    MDM Rules/Calculators/A&P                           Given patient's hypertension here.  Will check CBC, be met for renal function, and EKG and chest x-[place] Christine. Final Clinical Impression(s) / ED Diagnoses Final diagnoses:  None    Rx / DC Orders ED Discharge Orders     None        Pattricia Boss, MD 05/31/21 1538

## 2022-05-03 ENCOUNTER — Encounter: Payer: Self-pay | Admitting: *Deleted
